# Patient Record
Sex: Male | Born: 1955 | Race: White | Hispanic: No | Marital: Single
Health system: Southern US, Community
[De-identification: ages and names within clinical notes are randomized; demographics above are authoritative.]

---

## 2012-11-09 ENCOUNTER — Emergency Department: Payer: Self-pay | Admitting: Unknown Physician Specialty

## 2012-11-09 LAB — COMPREHENSIVE METABOLIC PANEL
Albumin: 4.3 g/dL (ref 3.4–5.0)
Alkaline Phosphatase: 100 U/L (ref 50–136)
BUN: 20 mg/dL — ABNORMAL HIGH (ref 7–18)
Bilirubin,Total: 0.6 mg/dL (ref 0.2–1.0)
Chloride: 105 mmol/L (ref 98–107)
Co2: 22 mmol/L (ref 21–32)
Creatinine: 1.14 mg/dL (ref 0.60–1.30)
EGFR (African American): >60
EGFR (Non-African Amer.): >60
Glucose: 87 mg/dL (ref 65–99)
Osmolality: 280 (ref 275–301)
Potassium: 4.3 mmol/L (ref 3.5–5.1)
SGPT (ALT): 16 U/L (ref 12–78)
Total Protein: 7.7 g/dL (ref 6.4–8.2)

## 2012-11-09 LAB — CBC
HCT: 48.2 % (ref 40.0–52.0)
MCH: 31.9 pg (ref 26.0–34.0)
MCHC: 33.6 g/dL (ref 32.0–36.0)
Platelet: 273 10*3/uL (ref 150–440)
RDW: 13.1 % (ref 11.5–14.5)
WBC: 5.7 10*3/uL (ref 3.8–10.6)

## 2012-11-09 LAB — LIPASE, BLOOD: Lipase: 68 U/L — ABNORMAL LOW (ref 73–393)

## 2012-11-10 LAB — URINALYSIS, COMPLETE
Bilirubin,UR: NEGATIVE
Blood: NEGATIVE
Glucose,UR: NEGATIVE mg/dL (ref 0–75)
Leukocyte Esterase: NEGATIVE
Nitrite: NEGATIVE
Ph: 5 (ref 4.5–8.0)
Protein: 30
RBC,UR: 1 /HPF (ref 0–5)
Specific Gravity: 1.03 (ref 1.003–1.030)
Squamous Epithelial: NONE SEEN
WBC UR: 1 /HPF (ref 0–5)

## 2012-11-10 LAB — TROPONIN I: Troponin-I: <0.02 ng/mL

## 2021-07-07 ENCOUNTER — Emergency Department (HOSPITAL_COMMUNITY)
Admission: EM | Admit: 2021-07-07 | Discharge: 2021-07-08 | Disposition: A | Discharge status: Home / Self Care | Payer: Medicaid Other | Attending: Emergency Medicine | Admitting: Emergency Medicine

## 2021-07-07 ENCOUNTER — Emergency Department (HOSPITAL_COMMUNITY): Payer: Medicaid Other

## 2021-07-07 ENCOUNTER — Other Ambulatory Visit: Payer: Self-pay

## 2021-07-07 DIAGNOSIS — R918 Other nonspecific abnormal finding of lung field: Secondary | ICD-10-CM

## 2021-07-07 DIAGNOSIS — M549 Dorsalgia, unspecified: Secondary | ICD-10-CM | POA: Diagnosis not present

## 2021-07-07 DIAGNOSIS — R0602 Shortness of breath: Secondary | ICD-10-CM | POA: Diagnosis present

## 2021-07-07 DIAGNOSIS — K209 Esophagitis, unspecified without bleeding: Secondary | ICD-10-CM

## 2021-07-07 DIAGNOSIS — J449 Chronic obstructive pulmonary disease, unspecified: Secondary | ICD-10-CM

## 2021-07-07 LAB — COMPREHENSIVE METABOLIC PANEL
ALT: 14 U/L (ref 0–44)
AST: 14 U/L — ABNORMAL LOW (ref 15–41)
Albumin: 4.1 g/dL (ref 3.5–5.0)
Alkaline Phosphatase: 73 U/L (ref 38–126)
Anion gap: 7 (ref 5–15)
BUN: 10 mg/dL (ref 8–23)
CO2: 26 mmol/L (ref 22–32)
Calcium: 9.5 mg/dL (ref 8.9–10.3)
Chloride: 104 mmol/L (ref 98–111)
Creatinine, Ser: 1.07 mg/dL (ref 0.61–1.24)
GFR, Estimated: >60 mL/min (ref >60)
Glucose, Bld: 102 mg/dL — ABNORMAL HIGH (ref 70–99)
Potassium: 4.3 mmol/L (ref 3.5–5.1)
Sodium: 137 mmol/L (ref 135–145)
Total Bilirubin: 0.7 mg/dL (ref 0.3–1.2)
Total Protein: 6.9 g/dL (ref 6.5–8.1)

## 2021-07-07 LAB — CBC WITH DIFFERENTIAL/PLATELET
Abs Immature Granulocytes: 0.03 10*3/uL (ref 0.00–0.07)
Basophils Absolute: 0 10*3/uL (ref 0.0–0.1)
Basophils Relative: 1 %
Eosinophils Absolute: 0.1 10*3/uL (ref 0.0–0.5)
Eosinophils Relative: 1 %
HCT: 47.3 % (ref 39.0–52.0)
Hemoglobin: 16 g/dL (ref 13.0–17.0)
Immature Granulocytes: 1 %
Lymphocytes Relative: 25 %
Lymphs Abs: 1.4 10*3/uL (ref 0.7–4.0)
MCH: 32.7 pg (ref 26.0–34.0)
MCHC: 33.8 g/dL (ref 30.0–36.0)
MCV: 96.7 fL (ref 80.0–100.0)
Monocytes Absolute: 0.5 10*3/uL (ref 0.1–1.0)
Monocytes Relative: 9 %
Neutro Abs: 3.7 10*3/uL (ref 1.7–7.7)
Neutrophils Relative %: 63 %
Platelets: 240 10*3/uL (ref 150–400)
RBC: 4.89 MIL/uL (ref 4.22–5.81)
RDW: 12.6 % (ref 11.5–15.5)
WBC: 5.8 10*3/uL (ref 4.0–10.5)
nRBC: 0 % (ref 0.0–0.2)

## 2021-07-07 LAB — TROPONIN I (HIGH SENSITIVITY)
Troponin I (High Sensitivity): 3 ng/L (ref <18)
Troponin I (High Sensitivity): 3 ng/L (ref <18)

## 2021-07-07 NOTE — ED Triage Notes (Signed)
Pt reports "lung pain" in his back for one week. Pain present only when breathing. Denies cough, n/v.

## 2021-07-07 NOTE — ED Notes (Signed)
No answer x 4  

## 2021-07-07 NOTE — ED Provider Notes (Signed)
Emergency Medicine Provider Triage Evaluation Note  Mike Mccall , a 65 y.o. male  was evaluated in triage.  Pt complains of pain with inspiration.  Patient states his symptoms have been occurring for the past week.  Describes a constant sharp pain that radiates across his upper back worsens with deep breathing.  Denies any chest pain, shortness of breath, hemoptysis, leg swelling, abdominal pain, nausea, vomiting, or diaphoresis.  Patient states that he quit smoking 1 year ago.  Physical Exam  Ht 6' (1.829 m)   Wt 74.8 kg   BMI 22.38 kg/m  Gen:   Awake, no distress   Resp:  Normal effort  MSK:   Moves extremities without difficulty  Other:  RRR without M/R/G. LCTAB. No TTP appreciated in the chest or thoracic region. No leg swelling 2+ radial/DP pulses.  Medical Decision Making  Medically screening exam initiated at 5:22 PM.  Appropriate orders placed.  Mike Mccall was informed that the remainder of the evaluation will be completed by another provider, this initial triage assessment does not replace that evaluation, and the importance of remaining in the ED until their evaluation is complete.   Mike Sou, PA-C 07/07/21 1723    [name], MD 07/08/21 1109

## 2021-07-08 ENCOUNTER — Encounter (HOSPITAL_COMMUNITY): Payer: Self-pay | Admitting: Emergency Medicine

## 2021-07-08 ENCOUNTER — Emergency Department (HOSPITAL_COMMUNITY): Payer: Medicaid Other

## 2021-07-08 MED ORDER — ALBUTEROL SULFATE HFA 108 (90 BASE) MCG/ACT IN AERS: 2.0000 | INHALATION_SPRAY | RESPIRATORY_TRACT | 0 refills | Status: AC | PRN

## 2021-07-08 MED ORDER — OMEPRAZOLE 20 MG PO CPDR: 20.0000 mg | DELAYED_RELEASE_CAPSULE | Freq: Every day | ORAL | 0 refills | Status: AC

## 2021-07-08 MED ORDER — IOHEXOL 350 MG/ML SOLN
100.0000 mL | Freq: Once | INTRAVENOUS | Status: AC | PRN
  Administered 2021-07-08: 50 mL via INTRAVENOUS

## 2021-07-08 MED ORDER — ACETAMINOPHEN 500 MG PO TABS
1000.0000 mg | ORAL_TABLET | Freq: Once | ORAL | Status: AC
  Administered 2021-07-08: 1000 mg via ORAL
  Filled 2021-07-08: qty 2

## 2021-07-08 NOTE — ED Provider Notes (Addendum)
[facility] EMERGENCY DEPARTMENT Provider Note   CSN: [id number] Arrival date & time: 07/07/21  1547     History Chief Complaint  Patient presents with   Shortness of Breath    Mike Mccall is a 65 y.o. male.  The history is provided by the patient.  Shortness of Breath Severity:  Moderate Onset quality:  Gradual Duration:  1 week Timing:  Constant Progression:  Unchanged Chronicity:  New Context: not URI and not weather changes   Relieved by:  Nothing Worsened by:  Nothing Ineffective treatments:  None tried Associated symptoms: no abdominal pain, no chest pain, no cough, no fever, no hemoptysis, no rash, no sore throat, no sputum production, no syncope and no wheezing   Associated symptoms comment:  Back pain with breathing  Risk factors: no recent alcohol use, no hx of PE/DVT and no obesity   Patient is a recently former smoker who complains of SOB, and pain in the back with inspiration for more than one week.  No leg pain or swelling.  No CP, no exertional symptoms.  No n/v/d.  No f/c/r.      History reviewed. No pertinent past medical history.  There are no problems to display for this patient.   History reviewed. No pertinent surgical history.     History reviewed. No pertinent family history.     Home Medications Prior to Admission medications   Not on File    Allergies    Patient has no known allergies.  Review of Systems   Review of Systems  Constitutional:  Negative for fever.  HENT:  Negative for facial swelling and sore throat.   Eyes:  Negative for redness.  Respiratory:  Positive for shortness of breath. Negative for cough, hemoptysis, sputum production and wheezing.   Cardiovascular:  Negative for chest pain, palpitations, leg swelling and syncope.  Gastrointestinal:  Negative for abdominal pain.  Genitourinary:  Negative for difficulty urinating.  Musculoskeletal:  Negative for neck stiffness.  Skin:  Negative for  rash.  Neurological:  Negative for facial asymmetry.  Psychiatric/Behavioral:  Negative for agitation.   All other systems reviewed and are negative.  Physical Exam Updated Vital Signs BP (!) 152/95   Pulse (!) 55   Temp 98 F (36.7 C)   Resp (!) 77   Ht 6' (1.829 m)   Wt 74.8 kg   SpO2 99%   BMI 22.38 kg/m   Physical Exam Vitals and nursing note reviewed.  Constitutional:      General: He is not in acute distress.    Appearance: Normal appearance.  HENT:     Head: Normocephalic and atraumatic.     Nose: Nose normal.  Eyes:     Conjunctiva/sclera: Conjunctivae normal.     Pupils: Pupils are equal, round, and reactive to light.  Cardiovascular:     Rate and Rhythm: Normal rate and regular rhythm.     Pulses: Normal pulses.     Heart sounds: Normal heart sounds.  Pulmonary:     Breath sounds: Decreased breath sounds present. No wheezing or rales.  Abdominal:     General: Abdomen is flat. Bowel sounds are normal.     Palpations: Abdomen is soft.     Tenderness: There is no abdominal tenderness. There is no guarding.  Musculoskeletal:        General: No tenderness. Normal range of motion.     Cervical back: Normal range of motion and neck supple.  Right lower leg: No edema.     Left lower leg: No edema.  Skin:    General: Skin is warm and dry.     Capillary Refill: Capillary refill takes less than 2 seconds.  Neurological:     General: No focal deficit present.     Mental Status: He is alert and oriented to person, place, and time.     Deep Tendon Reflexes: Reflexes normal.  Psychiatric:        Mood and Affect: Mood normal.        Behavior: Behavior normal.    ED Results / Procedures / Treatments   Labs (all labs ordered are listed, but only abnormal results are displayed) Results for orders placed or performed during the hospital encounter of 07/07/21  Comprehensive metabolic panel  Result Value Ref Range   Sodium 137 135 - 145 mmol/L   Potassium 4.3 3.5  - 5.1 mmol/L   Chloride 104 98 - 111 mmol/L   CO2 26 22 - 32 mmol/L   Glucose, Bld 102 (H) 70 - 99 mg/dL   BUN 10 8 - 23 mg/dL   Creatinine, Ser 4.09 0.61 - 1.24 mg/dL   Calcium 9.5 8.9 - 81.1 mg/dL   Total Protein 6.9 6.5 - 8.1 g/dL   Albumin 4.1 3.5 - 5.0 g/dL   AST 14 (L) 15 - 41 U/L   ALT 14 0 - 44 U/L   Alkaline Phosphatase 73 38 - 126 U/L   Total Bilirubin 0.7 0.3 - 1.2 mg/dL   GFR, Estimated >91 >47 mL/min   Anion gap 7 5 - 15  CBC with Differential  Result Value Ref Range   WBC 5.8 4.0 - 10.5 K/uL   RBC 4.89 4.22 - 5.81 MIL/uL   Hemoglobin 16.0 13.0 - 17.0 g/dL   HCT 82.9 56.2 - 13.0 %   MCV 96.7 80.0 - 100.0 fL   MCH 32.7 26.0 - 34.0 pg   MCHC 33.8 30.0 - 36.0 g/dL   RDW 86.5 78.4 - 69.6 %   Platelets 240 150 - 400 K/uL   nRBC 0.0 0.0 - 0.2 %   Neutrophils Relative % 63 %   Neutro Abs 3.7 1.7 - 7.7 K/uL   Lymphocytes Relative 25 %   Lymphs Abs 1.4 0.7 - 4.0 K/uL   Monocytes Relative 9 %   Monocytes Absolute 0.5 0.1 - 1.0 K/uL   Eosinophils Relative 1 %   Eosinophils Absolute 0.1 0.0 - 0.5 K/uL   Basophils Relative 1 %   Basophils Absolute 0.0 0.0 - 0.1 K/uL   Immature Granulocytes 1 %   Abs Immature Granulocytes 0.03 0.00 - 0.07 K/uL  Troponin I (High Sensitivity)  Result Value Ref Range   Troponin I (High Sensitivity) 3 <18 ng/L  Troponin I (High Sensitivity)  Result Value Ref Range   Troponin I (High Sensitivity) 3 <18 ng/L   DG Chest 1 View  Result Date: 07/07/2021 CLINICAL DATA:  65 year old male with shortness of breath. EXAM: CHEST  1 VIEW COMPARISON:  Chest radiograph dated 12/25/2015. FINDINGS: No focal consolidation, pleural effusion, or pneumothorax. The cardiac silhouette is within limits. No acute osseous pathology. IMPRESSION: No active disease. Electronically Signed   By: Elgie Collard M.D.   On: 07/07/2021 17:48     EKG  EKG Interpretation  Date/Time:  Saturday July 07 2021 17:27:14 EDT Ventricular Rate:  61 PR Interval:  150 QRS  Duration: 82 QT Interval:  376 QTC Calculation: 378 R Axis:  5 Text Interpretation: Normal sinus rhythm with sinus arrhythmia Confirmed by Nicanor Alcon, Elenna Spratling ([id number]) on 07/08/2021 6:28:16 AM         Radiology DG Chest 1 View  Result Date: 07/07/2021 CLINICAL DATA:  65 year old male with shortness of breath. EXAM: CHEST  1 VIEW COMPARISON:  Chest radiograph dated 12/25/2015. FINDINGS: No focal consolidation, pleural effusion, or pneumothorax. The cardiac silhouette is within limits. No acute osseous pathology. IMPRESSION: No active disease. Electronically Signed   By: Elgie Collard M.D.   On: 07/07/2021 17:48    Procedures Procedures   Medications Ordered in ED Medications  iohexol (OMNIPAQUE) 350 MG/ML injection 100 mL (50 mLs Intravenous Contrast Given 07/08/21 5732)    ED Course  I have reviewed the triage vital signs and the nursing notes.  Pertinent labs & imaging results that were available during my care of the patient were reviewed by me and considered in my medical decision making (see chart for details).   Ruled out for MI in the ED with negative EKG and 2 negative troponins and highly atypical story for ACS.  Heart score is 2 low risk for MACE.  Ruled out for PE in the ED with negative CTA.  I suspect this is COPD, but patient is not wheezing.  Will start inhaler and refer to PMD.  Informed of esophagitis and PPI ordered.  Also informed of pulmonary nodules and need for repeat CT scan, this was informed to the patient verbally and on discharge papers in writing.    Mike Mccall was evaluated in Emergency Department on 07/08/2021 for the symptoms described in the history of present illness. He was evaluated in the context of the global COVID-19 pandemic, which necessitated consideration that the patient might be at risk for infection with the SARS-CoV-2 virus that causes COVID-19. Institutional protocols and algorithms that pertain to the evaluation of patients at risk for  COVID-19 are in a state of rapid change based on information released by regulatory bodies including the CDC and federal and state organizations. These policies and algorithms were followed during the patient's care in the ED.     Final Clinical Impression(s) / ED Diagnoses eturn for intractable cough, coughing up blood, fevers > 100.4 unrelieved by medication, shortness of breath, intractable vomiting, chest pain, shortness of breath, weakness, numbness, changes in speech, facial asymmetry, abdominal pain, passing out, Inability to tolerate liquids or food, cough, altered mental status or any concerns. No signs of systemic illness or infection. The patient is nontoxic-appearing on exam and vital signs are within normal limits. I have reviewed the triage vital signs and the nursing notes. Pertinent labs & imaging results that were available during my care of the patient were reviewed by me and considered in my medical decision making (see chart for details). After history, exam, and medical workup I feel the patient has been appropriately medically screened and is safe for discharge home. Pertinent diagnoses were discussed with the patient. Patient was given return precautions.      Nevea Spiewak, MD 07/08/21 2025    Cy Blamer, MD 07/08/21 [id number]

## 2021-07-08 NOTE — ED Notes (Signed)
Patient verbalizes understanding of discharge instructions. Opportunity for questioning and answers were provided. Armband removed by staff, pt discharged from ED ambulatory.   

## 2021-10-16 ENCOUNTER — Ambulatory Visit: Payer: Self-pay | Admitting: Internal Medicine

## 2023-03-02 IMAGING — CT CT ANGIO CHEST
2 of 7 series · 18 of 46 positions shown · IV contrast (omnipaque)
Comparison: 01/23/2009

CLINICAL DATA: Chest and back pain while breathing.

EXAM:
CT ANGIOGRAPHY CHEST WITH CONTRAST
TECHNIQUE: Multidetector CT imaging of the chest was performed using the
standard protocol during bolus administration of intravenous
contrast. Multiplanar CT image reconstructions and MIPs were
obtained to evaluate the vascular anatomy.
CONTRAST:  50mL OMNIPAQUE IOHEXOL 350 MG/ML SOLN

[Series 7: thins · axial · 0.67mm/px · z∈[-255,+14]mm · 15 of 433 slices shown]
[im 25/433  lung]
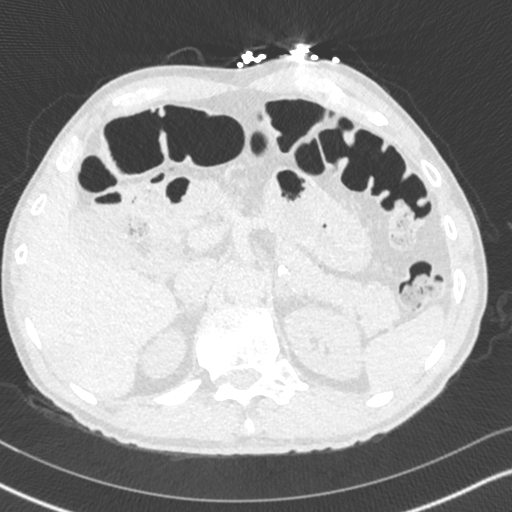
[im 49/433  soft-tissue]
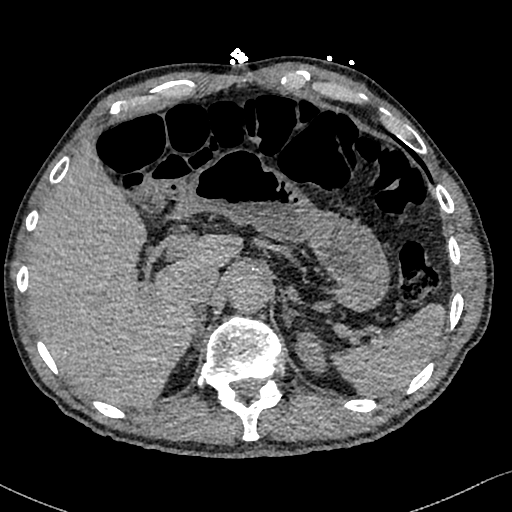
[im 73/433  lung]
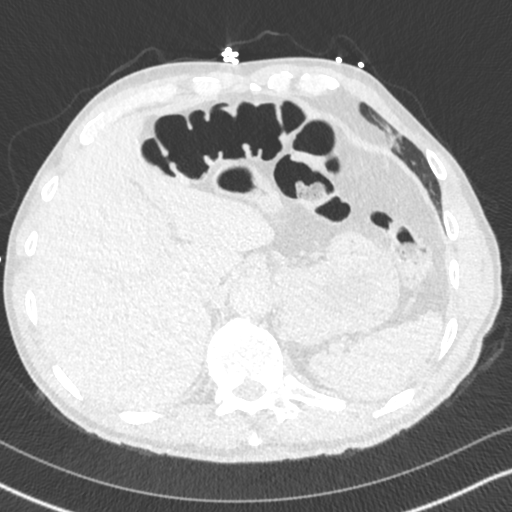
[im 97/433  soft-tissue]
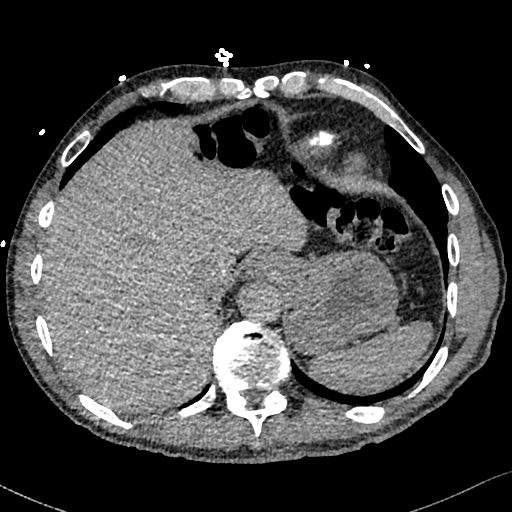
[im 145/433  lung]
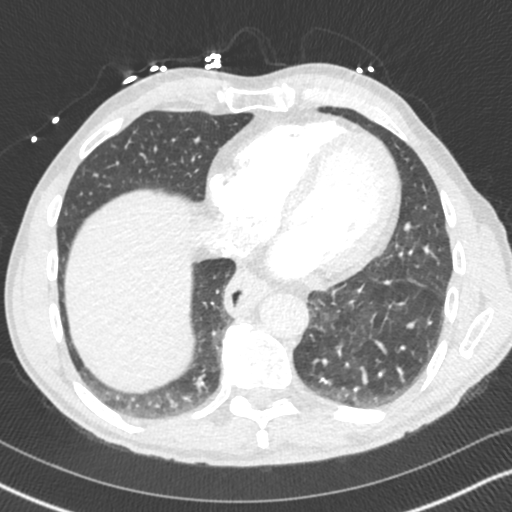
[im 169/433  soft-tissue]
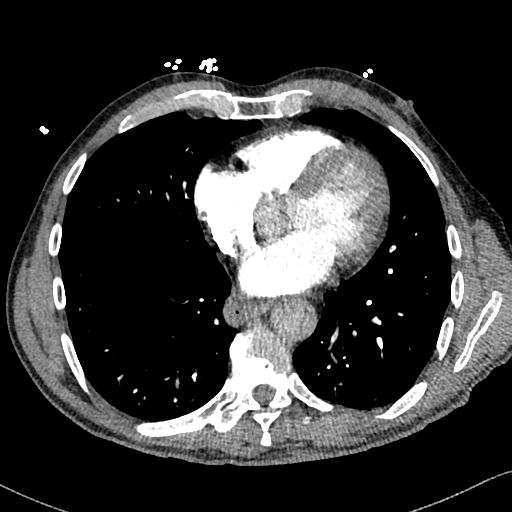
[im 193/433  lung]
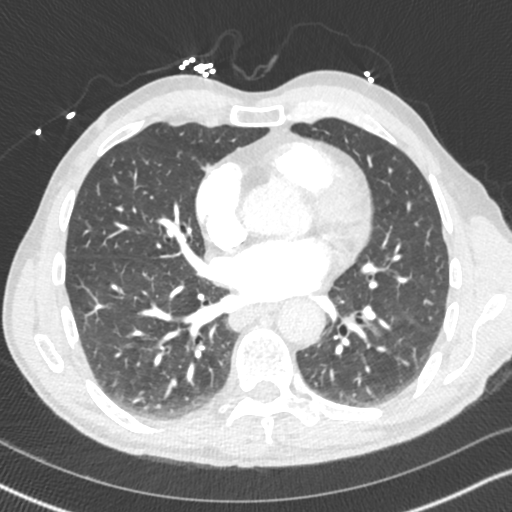
[im 217/433  soft-tissue]
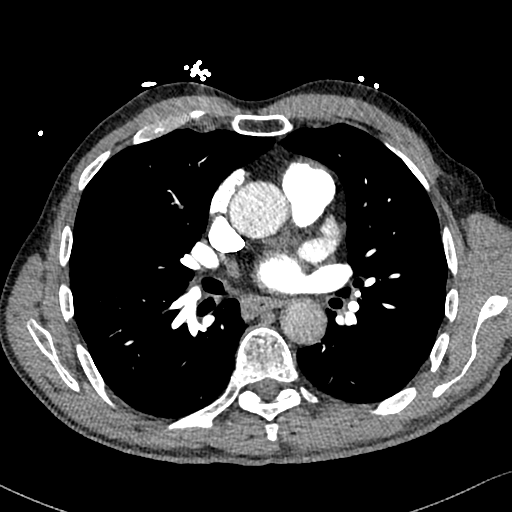
[im 241/433  lung]
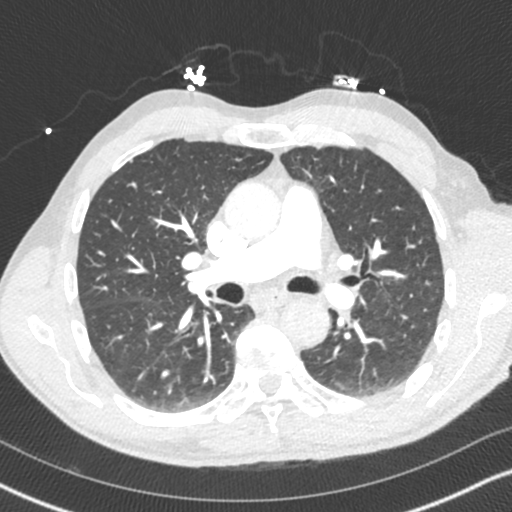
[im 265/433  soft-tissue]
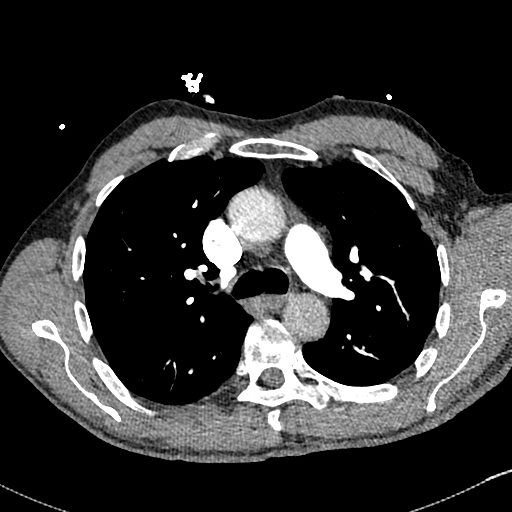
[im 289/433  lung]
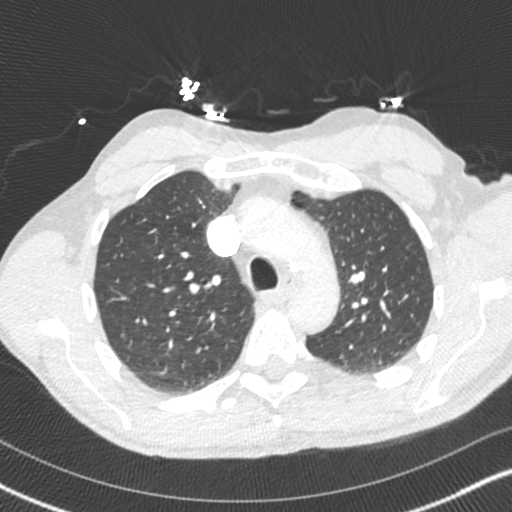
[im 337/433  soft-tissue]
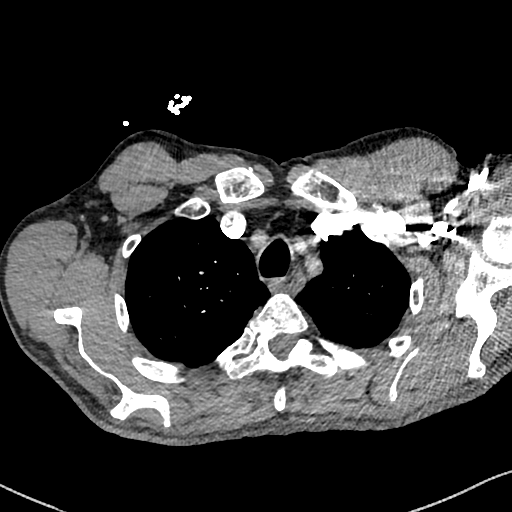
[im 361/433  lung]
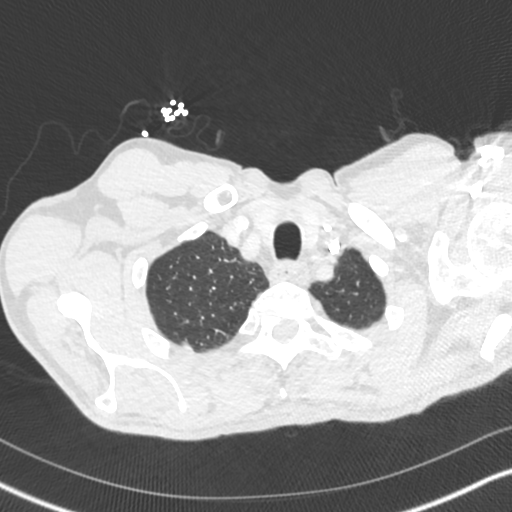
[im 385/433  soft-tissue]
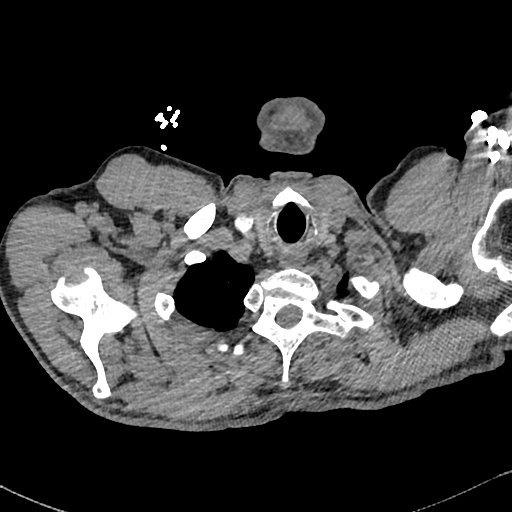
[im 409/433  lung]
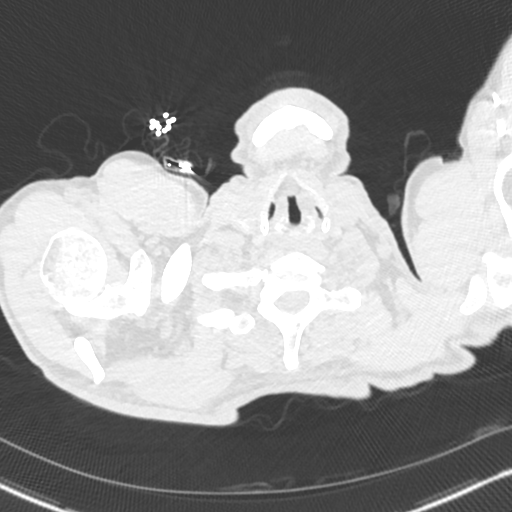

[Series 8: cor · coronal · 0.62mm/px · 3 of 156 slices shown]
[im 39/156  soft-tissue]
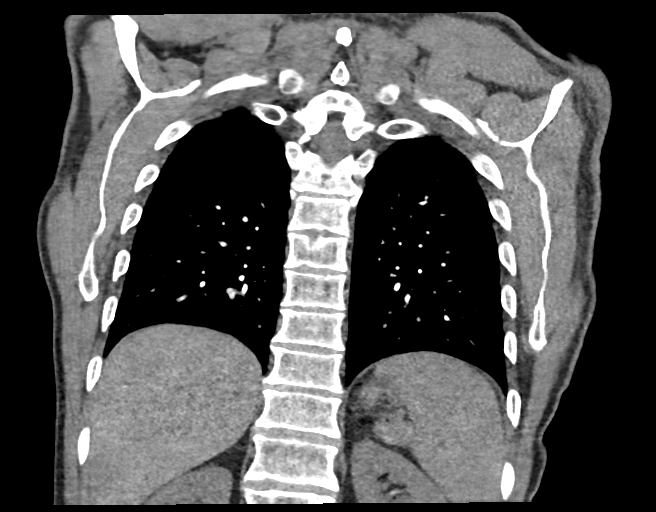
[im 78/156  soft-tissue]
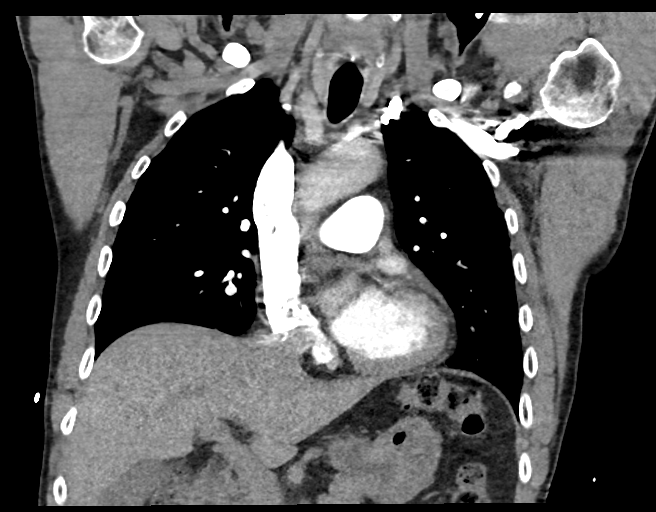
[im 117/156  soft-tissue]
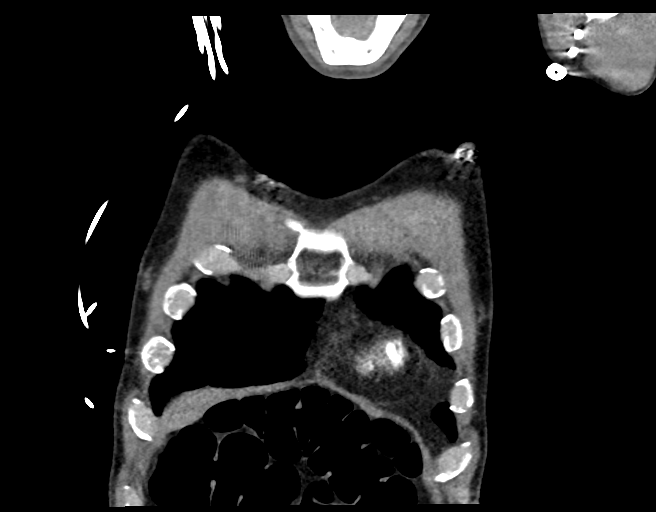

[18 of 46 positions shown; findings below may reference images not displayed]

FINDINGS: Cardiovascular: The heart size is normal. No substantial pericardial
effusion. There is no filling defect within the opacified pulmonary
arteries to suggest the presence of an acute pulmonary embolus.

Mediastinum/Nodes: No mediastinal lymphadenopathy. There is no hilar
lymphadenopathy. Mild circumferential wall thickening noted distal
esophagus There is no axillary lymphadenopathy.

Lungs/Pleura: No pulmonary edema or pleural effusion. No focal
airspace consolidation. 3 mm right upper lobe pulmonary nodule
identified on image 35/series 6. 5 mm left lower lobe nodule
identified on 73/6. No pneumothorax.

Upper Abdomen: Unremarkable.

Musculoskeletal: No worrisome lytic or sclerotic osseous
abnormality.

Review of the MIP images confirms the above findings.
IMPRESSION: 1. No CT evidence for acute pulmonary embolus.
2. Mild circumferential wall thickening distal esophagus.
Esophagitis would be a consideration.
3. Single tiny bilateral pulmonary nodules measuring up to 5 mm. No
follow-up needed if patient is low-risk (and has no known or
suspected primary neoplasm). Non-contrast chest CT can be considered
in 12 months if patient is high-risk. This recommendation follows
the consensus statement: Guidelines for Management of Incidental
Pulmonary Nodules Detected on CT Images: From the [HOSPITAL]
